# Patient Record
Sex: Male | Born: 1995 | Race: White | Hispanic: No | Marital: Single | Smoking: Never smoker
Health system: Southern US, Community
[De-identification: ages and names within clinical notes are randomized; demographics above are authoritative.]

---
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
## 2017-09-07 ENCOUNTER — Encounter (HOSPITAL_COMMUNITY): Payer: Self-pay

## 2017-09-07 ENCOUNTER — Emergency Department (HOSPITAL_COMMUNITY)
Admission: EM | Admit: 2017-09-07 | Discharge: 2017-09-08 | Disposition: A | Discharge status: Home / Self Care | Payer: Medicaid Other | Attending: Emergency Medicine | Admitting: Emergency Medicine

## 2017-09-07 DIAGNOSIS — S0990XA Unspecified injury of head, initial encounter: Secondary | ICD-10-CM

## 2017-09-07 DIAGNOSIS — Y939 Activity, unspecified: Secondary | ICD-10-CM | POA: Diagnosis not present

## 2017-09-07 DIAGNOSIS — W1830XA Fall on same level, unspecified, initial encounter: Secondary | ICD-10-CM | POA: Insufficient documentation

## 2017-09-07 DIAGNOSIS — Y990 Civilian activity done for income or pay: Secondary | ICD-10-CM | POA: Insufficient documentation

## 2017-09-07 DIAGNOSIS — Y929 Unspecified place or not applicable: Secondary | ICD-10-CM | POA: Diagnosis not present

## 2017-09-07 DIAGNOSIS — S060X1A Concussion with loss of consciousness of 30 minutes or less, initial encounter: Secondary | ICD-10-CM | POA: Diagnosis present

## 2017-09-07 DIAGNOSIS — R51 Headache: Secondary | ICD-10-CM | POA: Insufficient documentation

## 2017-09-07 NOTE — ED Triage Notes (Signed)
Pt slipped and fell on an oil spot yesterday at work, he hit his head on a metal rack and states that he passed out Pt says he was out for 10 minutes and it was witnessed by his boss and mother, noone called EMS they just tried to wake him up Pt wants to be checked for a concussion to day

## 2017-09-08 MED ORDER — METOCLOPRAMIDE HCL 10 MG PO TABS: 10.0000 mg | ORAL_TABLET | Freq: Four times a day (QID) | ORAL | 0 refills | Status: AC | PRN

## 2017-09-08 MED ORDER — IBUPROFEN 600 MG PO TABS: 600.0000 mg | ORAL_TABLET | Freq: Four times a day (QID) | ORAL | 0 refills | Status: AC | PRN

## 2017-09-08 NOTE — Discharge Instructions (Signed)
It is likely that you have a mild concussion as a result of your head injury.  We recommend that you avoid strenuous activity or heavy lifting for the next 5 days.  Continue with ibuprofen every 6 hours as needed for headache.  You may supplement this with Reglan as needed for nausea.  Follow-up with a primary care doctor to ensure resolution of symptoms.  You may return to the emergency department, as needed, if symptoms persist or worsen.

## 2017-09-08 NOTE — ED Provider Notes (Signed)
[facility]-EMERGENCY DEPT Provider Note   CSN: [id number] Arrival date & time: 09/07/17  1957    History   Chief Complaint Chief Complaint  Patient presents with  . Head Injury    HPI Nathan Rose is a 22 y.o. male.  22 year old male presents to the emergency department for evaluation of head injury.  He states that he was working yesterday when he slipped on some oil falling forward into a metal rack.  He reports losing consciousness at this time.  He believes he was unconscious for 10 minutes, but is specifically unsure of the length of time he was unconscious.  Patient took ibuprofen yesterday for headache.  He continues to have a headache today which is primarily in his frontal region radiating around to his bilateral temples and occiput.  He took 2 tablets of ibuprofen today with little improvement.  He has had some nausea which has been waxing and waning.  No vomiting.  No blurry vision, vision loss, hearing loss, extremity numbness or weakness.  He has not had any photophobia or phonophobia.  No history of prior concussion.   The history is provided by the patient. No language interpreter was used.  Head Injury      History reviewed. No pertinent past medical history.  There are no active problems to display for this patient.   History reviewed. No pertinent surgical history.     Home Medications    Prior to Admission medications   Medication Sig Start Date End Date Taking? Authorizing Provider  ibuprofen (ADVIL,MOTRIN) 600 MG tablet Take 1 tablet (600 mg total) by mouth every 6 (six) hours as needed. 09/08/17   Antony MaduraHumes, Elridge Stemm, PA-C  lisdexamfetamine (VYVANSE) 50 MG capsule Take 50 mg by mouth daily.    [provider]  metoCLOPramide (REGLAN) 10 MG tablet Take 1 tablet (10 mg total) by mouth every 6 (six) hours as needed (for nausea or headache). 09/08/17   Antony MaduraHumes, Abad Manard, PA-C  promethazine (PHENERGAN) 25 MG tablet Take 1 tablet (25 mg  total) by mouth every 6 (six) hours as needed for nausea or vomiting. 06/04/16   Antony MaduraHumes, Eliana Lueth, PA-C    Family History History reviewed. No pertinent family history.  Social History Social History   Tobacco Use  . Smoking status: Never Smoker  . Smokeless tobacco: Never Used  Substance Use Topics  . Alcohol use: No  . Drug use: No     Allergies   Patient has no known allergies.   Review of Systems Review of Systems Ten systems reviewed and are negative for acute change, except as noted in the HPI.    Physical Exam Updated Vital Signs BP 133/83 (BP Location: Right Arm)   Pulse 79   Temp 98.1 F (36.7 C) (Oral)   Resp 12   Ht 5\' 6"  (1.676 m)   Wt 68.3 kg (150 lb 9.6 oz)   SpO2 99%   BMI 24.31 kg/m   Physical Exam  Constitutional: He is oriented to person, place, and time. He appears well-developed and well-nourished. No distress.  Nontoxic appearing and in NAD  HENT:  Head: Normocephalic and atraumatic.  Abrasion to forehead without hematoma  Eyes: Conjunctivae and EOM are normal. Pupils are equal, round, and reactive to light. No scleral icterus.  EOMs normal.  No nystagmus.  Neck: Normal range of motion.  Cardiovascular: Normal rate, regular rhythm and intact distal pulses.  Pulmonary/Chest: Effort normal. No stridor. No respiratory distress.  Respirations even and unlabored  Musculoskeletal: Normal range of motion.  Neurological: He is alert and oriented to person, place, and time. No cranial nerve deficit. He exhibits normal muscle tone. Coordination normal.  GCS 15. Speech is goal oriented. No cranial nerve deficits appreciated; symmetric eyebrow raise, no facial drooping, tongue midline. Patient has equal grip strength bilaterally with 5/5 strength against resistance in all major muscle groups bilaterally. Sensation to light touch intact. Patient moves extremities without ataxia. Patient ambulatory with steady gait.  Skin: Skin is warm and dry. No rash noted.  He is not diaphoretic. No erythema. No pallor.  Psychiatric: He has a normal mood and affect. His behavior is normal.  Nursing note and vitals reviewed.    ED Treatments / Results  Labs (all labs ordered are listed, but only abnormal results are displayed) Labs Reviewed - No data to display  EKG  EKG Interpretation None       Radiology No results found.  Procedures Procedures (including critical care time)  Medications Ordered in ED Medications - No data to display   Initial Impression / Assessment and Plan / ED Course  I have reviewed the triage vital signs and the nursing notes.  Pertinent labs & imaging results that were available during my care of the patient were reviewed by me and considered in my medical decision making (see chart for details).     Patient presents to the emergency department for evaluation of headache which began yesterday after a fall into a metal rack.  +LOC without vomiting; intermittent nausea.  Neurologic exam today is nonfocal.  Doubt emergent intracranial process such as traumatic hydrocephalus, hemorrhage, skull fracture.  Have discussed the possibility of mild concussion with the patient as well as supportive management.  I do not believe further emergent workup is indicated at this time.  Return precautions discussed and provided.  Patient discharged in stable condition with no unaddressed concerns.   Final Clinical Impressions(s) / ED Diagnoses   Final diagnoses:  Minor head injury, initial encounter    ED Discharge Orders        Ordered    ibuprofen (ADVIL,MOTRIN) 600 MG tablet  Every 6 hours PRN     09/08/17 0004    metoCLOPramide (REGLAN) 10 MG tablet  Every 6 hours PRN     09/08/17 0004       Antony Madura, PA-C 09/08/17 0020    Palumbo, April, MD 09/08/17 6045

## 2017-12-20 IMAGING — DX DG ABDOMEN 2V
2 series · 2 of 2 positions shown · non-contrast
Comparison: None.

CLINICAL DATA: Vomiting for 1 hour

EXAM:
ABDOMEN - 2 VIEW

[abdomen erect]
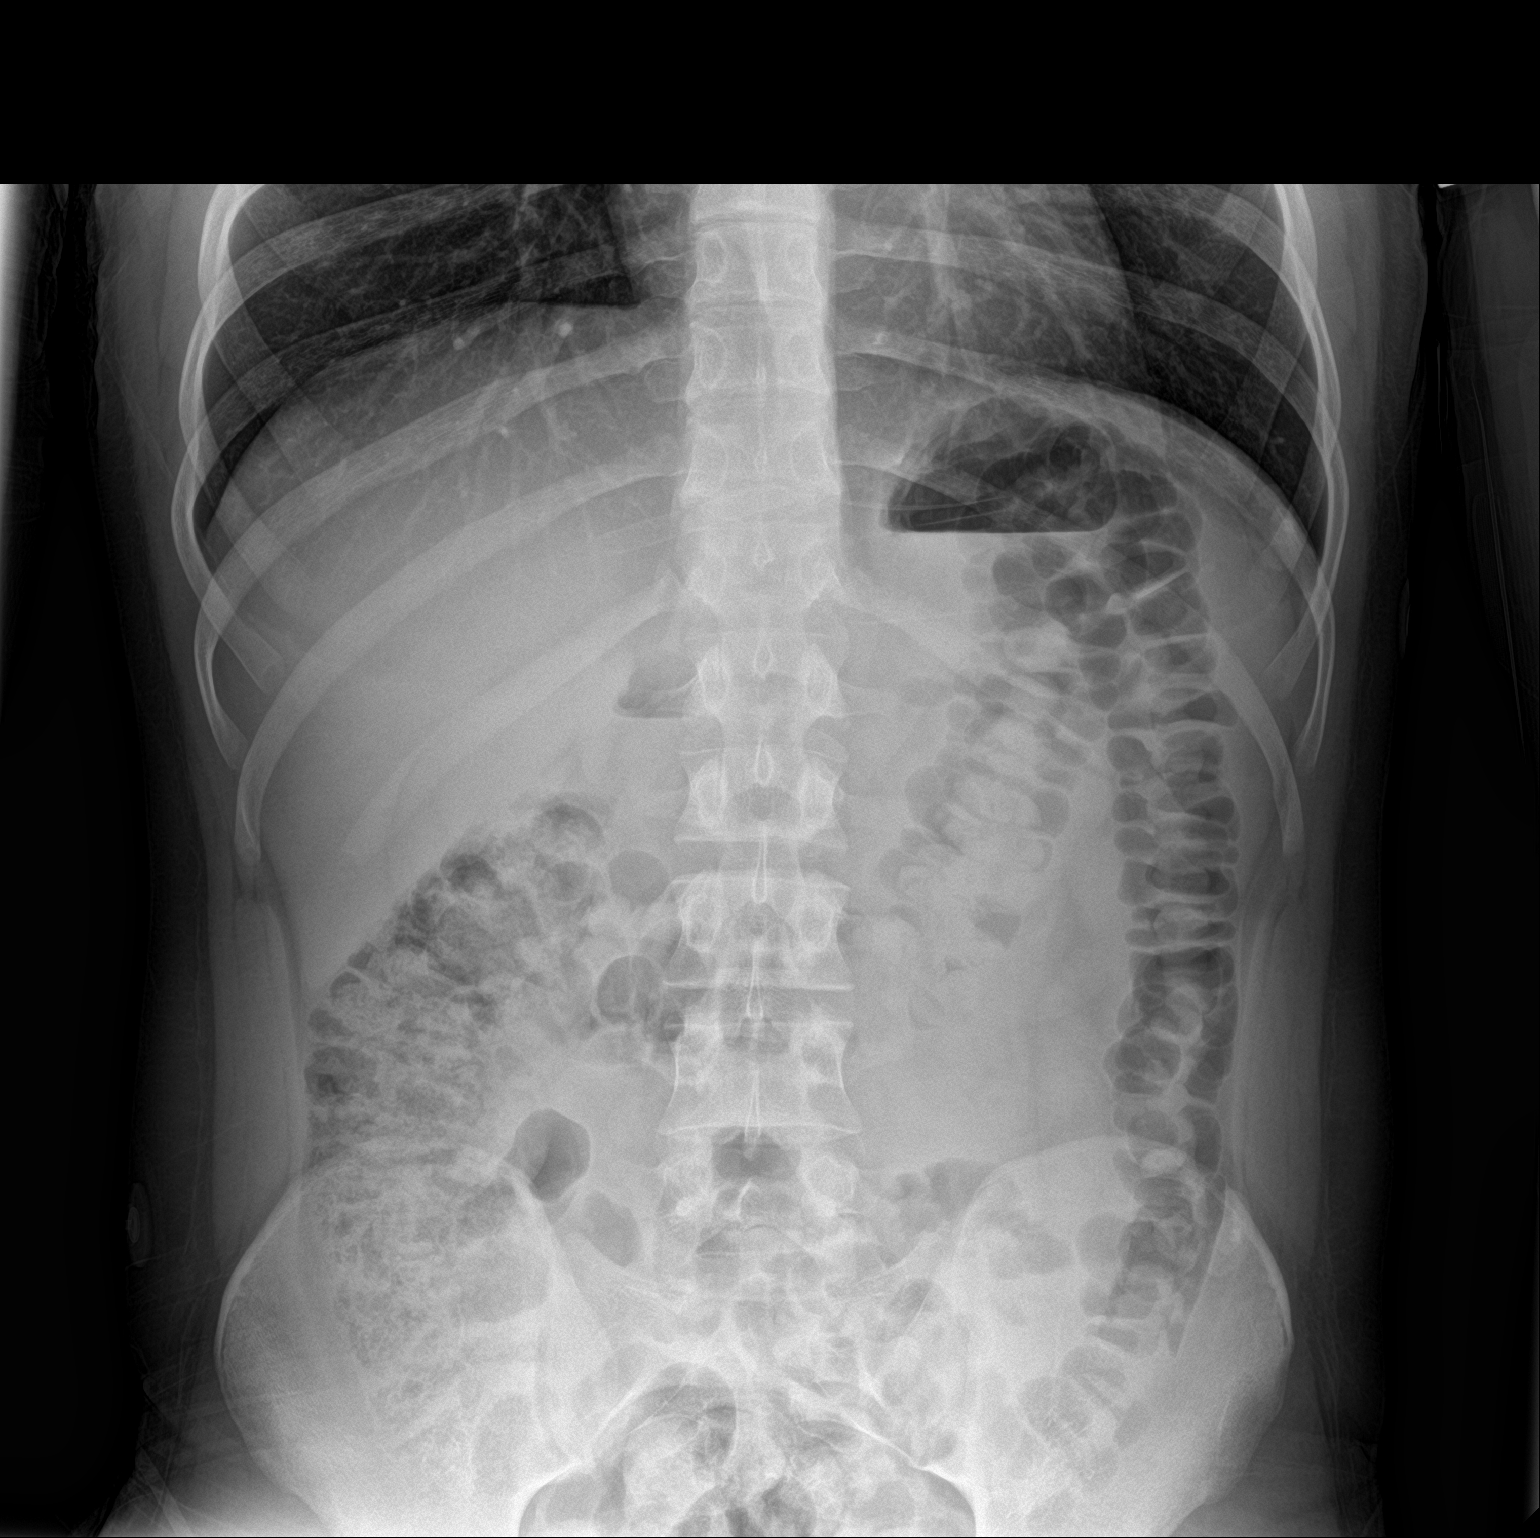

[abdomen supine]
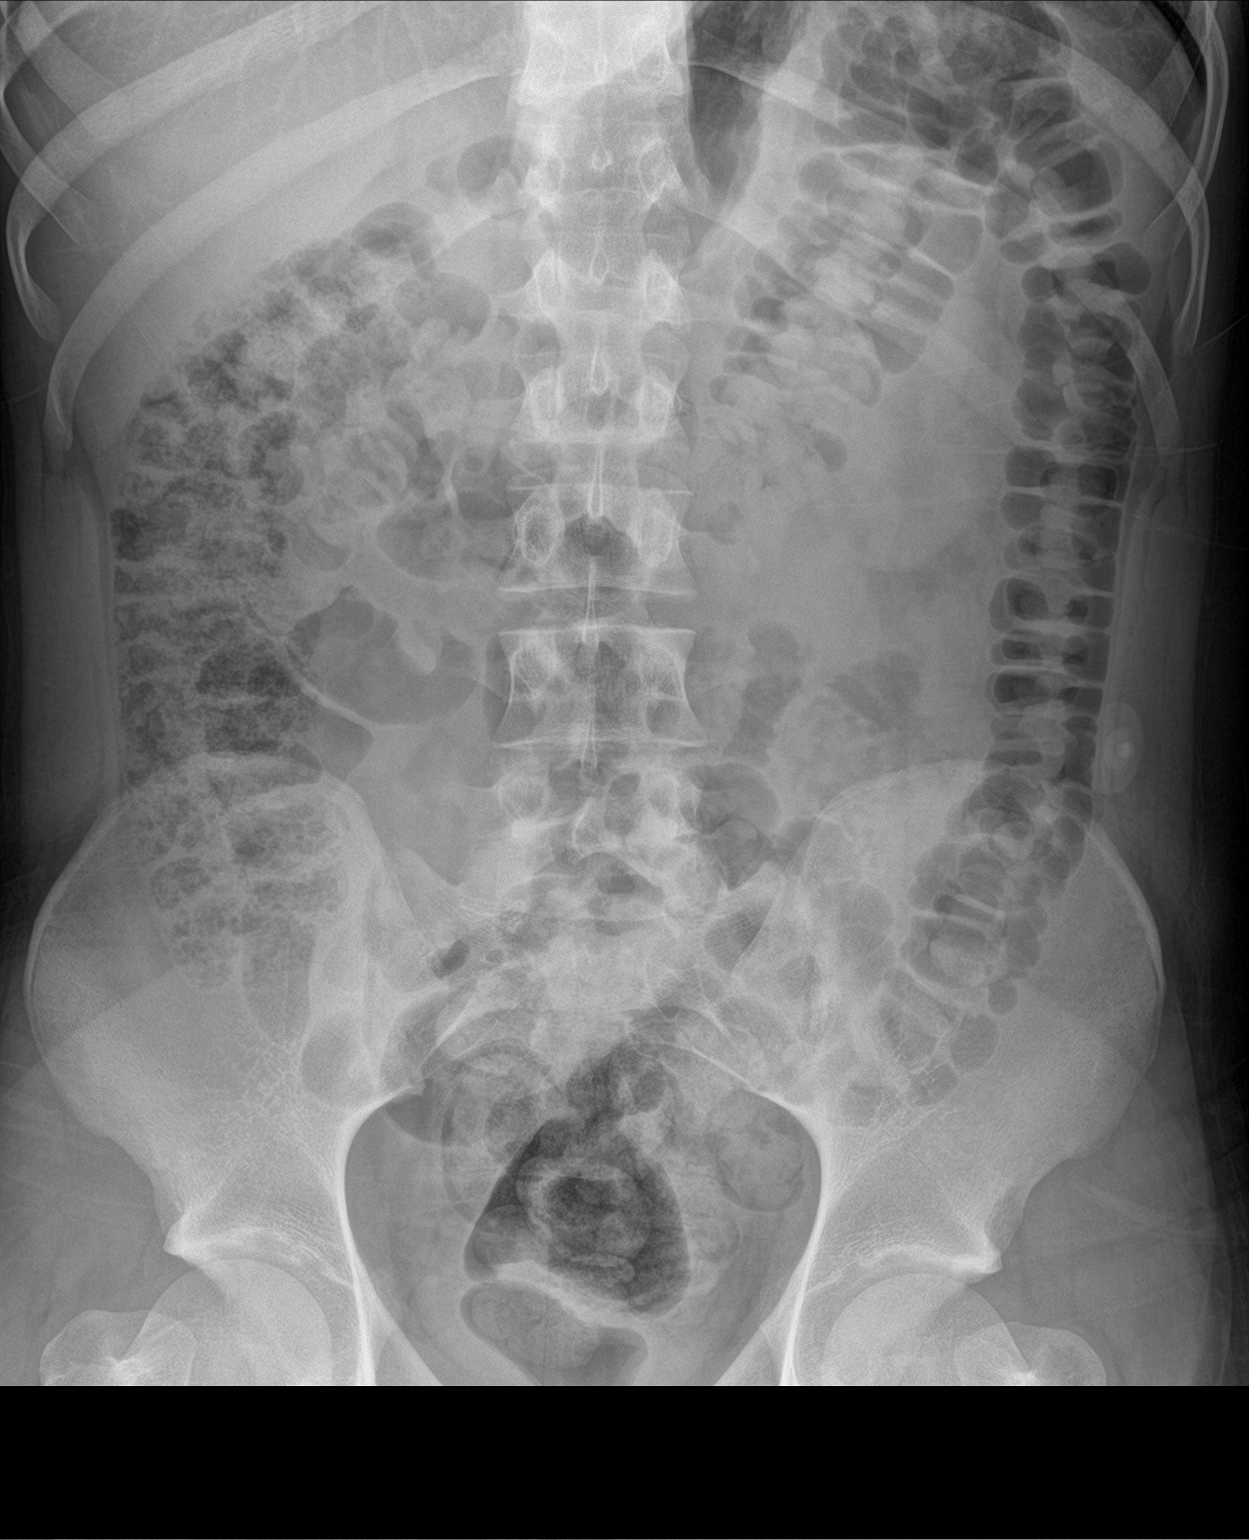

[2 of 2 positions shown; findings below may reference images not displayed]

FINDINGS: The bowel gas pattern is normal. There is a moderate amount of stool
within the ascending colon. There is no evidence of free air. No
radio-opaque calculi or other significant radiographic abnormality
is seen.
IMPRESSION: No radiographic evidence of obstruction. No free intraperitoneal
air.

## 2018-05-20 ENCOUNTER — Encounter (HOSPITAL_COMMUNITY): Payer: Self-pay | Admitting: Emergency Medicine

## 2018-05-20 ENCOUNTER — Other Ambulatory Visit: Payer: Self-pay

## 2018-05-20 ENCOUNTER — Emergency Department (HOSPITAL_COMMUNITY)
Admission: EM | Admit: 2018-05-20 | Discharge: 2018-05-21 | Disposition: A | Discharge status: Home / Self Care | Payer: Medicaid Other | Attending: Emergency Medicine | Admitting: Emergency Medicine

## 2018-05-20 DIAGNOSIS — W268XXA Contact with other sharp object(s), not elsewhere classified, initial encounter: Secondary | ICD-10-CM | POA: Insufficient documentation

## 2018-05-20 DIAGNOSIS — S61512A Laceration without foreign body of left wrist, initial encounter: Secondary | ICD-10-CM

## 2018-05-20 DIAGNOSIS — Y999 Unspecified external cause status: Secondary | ICD-10-CM | POA: Insufficient documentation

## 2018-05-20 DIAGNOSIS — Z79899 Other long term (current) drug therapy: Secondary | ICD-10-CM | POA: Insufficient documentation

## 2018-05-20 DIAGNOSIS — Y92831 Amusement park as the place of occurrence of the external cause: Secondary | ICD-10-CM | POA: Insufficient documentation

## 2018-05-20 DIAGNOSIS — Y9301 Activity, walking, marching and hiking: Secondary | ICD-10-CM | POA: Diagnosis not present

## 2018-05-20 NOTE — ED Notes (Signed)
Bed: WTR9 Expected date:  Expected time:  Means of arrival:  Comments: 

## 2018-05-20 NOTE — ED Notes (Signed)
Bed: WTR6 Expected date:  Expected time:  Means of arrival:  Comments: 

## 2018-05-20 NOTE — ED Triage Notes (Signed)
Pt from home with c/o laceration to left wrist. Pt works at [employer] and while working hit his wrist on an exposed nail. 2mm sized puncture wound is visible. Wound flushed and wrapped. Bleeding controlled. Ring is on left hand. Pt is attempting to move ring. Hand is swelling

## 2018-05-21 NOTE — ED Provider Notes (Signed)
[facility]-EMERGENCY DEPT Provider Note   CSN: [id number] Arrival date & time: 05/20/18  2248     History   Chief Complaint Chief Complaint  Patient presents with  . Laceration    HPI Nathan Rose is a 22 y.o. male.  Patient presents to the emergency department with a chief complaint of laceration.  He states that he cut his hand on a nail at a haunted house tonight.  Bleeding controlled with gauze.  Last tetanus shot was 1 year ago.  States that he has had some swelling in his middle finger, and has to struggle to get his ring off.  However, he denies injury to this finger.  He denies any other associated symptoms.  The history is provided by the patient. No language interpreter was used.    History reviewed. No pertinent past medical history.  There are no active problems to display for this patient.   History reviewed. No pertinent surgical history.      Home Medications    Prior to Admission medications   Medication Sig Start Date End Date Taking? Authorizing Provider  ibuprofen (ADVIL,MOTRIN) 600 MG tablet Take 1 tablet (600 mg total) by mouth every 6 (six) hours as needed. 09/08/17   Antony Madura, PA-C  lisdexamfetamine (VYVANSE) 50 MG capsule Take 50 mg by mouth daily.    [provider]  metoCLOPramide (REGLAN) 10 MG tablet Take 1 tablet (10 mg total) by mouth every 6 (six) hours as needed (for nausea or headache). 09/08/17   Antony Madura, PA-C  promethazine (PHENERGAN) 25 MG tablet Take 1 tablet (25 mg total) by mouth every 6 (six) hours as needed for nausea or vomiting. 06/04/16   Antony Madura, PA-C    Family History No family history on file.  Social History Social History   Tobacco Use  . Smoking status: Never Smoker  . Smokeless tobacco: Never Used  Substance Use Topics  . Alcohol use: No  . Drug use: No     Allergies   Patient has no known allergies.   Review of Systems Review of Systems  All other systems  reviewed and are negative.    Physical Exam Updated Vital Signs There were no vitals taken for this visit.  Physical Exam  Constitutional: He is oriented to person, place, and time. No distress.  HENT:  Head: Normocephalic and atraumatic.  Eyes: Pupils are equal, round, and reactive to light. Conjunctivae and EOM are normal.  Neck: No tracheal deviation present.  Cardiovascular: Normal rate.  Pulmonary/Chest: Effort normal. No respiratory distress.  Abdominal: Soft.  Musculoskeletal: Normal range of motion.  Neurological: He is alert and oriented to person, place, and time.  Skin: Skin is warm and dry. He is not diaphoretic.  Minor, 1 cm curvilinear laceration to the anterior aspect of distal left wrist, no deep structural involvement, no foreign body  Psychiatric: Judgment normal.  Nursing note and vitals reviewed.    ED Treatments / Results  Labs (all labs ordered are listed, but only abnormal results are displayed) Labs Reviewed - No data to display  EKG None  Radiology No results found.  Procedures .Foreign Body Removal Date/Time: 05/21/2018 1:12 AM Performed by: Roxy Horseman, PA-C Authorized by: Roxy Horseman, PA-C  Consent: Verbal consent obtained. Risks and benefits: risks, benefits and alternatives were discussed Consent given by: patient Patient understanding: patient states understanding of the procedure being performed Patient consent: the patient's understanding of the procedure matches consent given Procedure consent: procedure consent  matches procedure scheduled Relevant documents: relevant documents present and verified Test results: test results available and properly labeled Site marked: the operative site was marked Imaging studies: imaging studies available Required items: required blood products, implants, devices, and special equipment available Patient identity confirmed: verbally with patient Time out: Immediately prior to procedure  a "time out" was called to verify the correct patient, procedure, equipment, support staff and site/side marked as required. Body area: skin General location: upper extremity Location details: left long finger Patient restrained: no Patient cooperative: yes Complexity: simple 1 objects recovered. Objects recovered: Ring removed Post-procedure assessment: foreign body removed Patient tolerance: Patient tolerated the procedure well with no immediate complications Comments: Manual ring cutter was used to remove the patient's ring   (including critical care time) LACERATION REPAIR Performed by: Roxy Horseman Authorized by: Roxy Horseman Consent: Verbal consent obtained. Risks and benefits: risks, benefits and alternatives were discussed Consent given by: patient Patient identity confirmed: provided demographic data Prepped and Draped in normal sterile fashion Wound explored  Laceration Location: Left wrist  Laceration Length: 1 cm  No Foreign Bodies seen or palpated  Anesthesia: none  Local anesthetic: none  Anesthetic total:0 Irrigation method: syringe Amount of cleaning: standard  Skin closure: Dermabond  Number of sutures: Dermabond  Technique: Dermabond  Patient tolerance: Patient tolerated the procedure well with no immediate complications.  Medications Ordered in ED Medications - No data to display   Initial Impression / Assessment and Plan / ED Course  I have reviewed the triage vital signs and the nursing notes.  Pertinent labs & imaging results that were available during my care of the patient were reviewed by me and considered in my medical decision making (see chart for details).     Small laceration to left wrist.  Repaired with Dermabond.  No foreign body.  Tetanus shot is up-to-date.  Ring from left finger is removed by me.  Normal range of motion and strength of all fingers of left hand isolated all joints.  Final Clinical Impressions(s) / ED  Diagnoses   Final diagnoses:  Laceration of left wrist, initial encounter    ED Discharge Orders    None       Roxy Horseman, PA-C 05/21/18 0117    Gilda Crease, MD 05/21/18 [phone]

## 2018-06-14 ENCOUNTER — Ambulatory Visit (HOSPITAL_COMMUNITY)
Admission: RE | Admit: 2018-06-14 | Discharge: 2018-06-14 | Disposition: A | Discharge status: Home / Self Care | Payer: Federal, State, Local not specified - Other | Attending: Psychiatry | Admitting: Psychiatry

## 2018-06-14 DIAGNOSIS — F331 Major depressive disorder, recurrent, moderate: Secondary | ICD-10-CM | POA: Diagnosis present

## 2018-06-14 NOTE — H&P (Signed)
Behavioral Health Medical Screening Exam  Nathan Rose is an 22 y.o. male.  Total Time spent with patient: 20 minutes  Psychiatric Specialty Exam: Physical Exam  Nursing note and vitals reviewed. Constitutional: He is oriented to person, place, and time. He appears well-developed and well-nourished.  Cardiovascular: Normal rate.  Respiratory: Effort normal.  Musculoskeletal: Normal range of motion.  Neurological: He is alert and oriented to person, place, and time.  Skin: Skin is warm.    Review of Systems  Constitutional: Negative.   HENT: Negative.   Eyes: Negative.   Respiratory: Negative.   Cardiovascular: Negative.   Gastrointestinal: Negative.   Genitourinary: Negative.   Musculoskeletal: Negative.   Skin: Negative.   Neurological: Negative.   Endo/Heme/Allergies: Negative.   Psychiatric/Behavioral: Positive for depression. Negative for hallucinations, substance abuse and suicidal ideas. The patient is nervous/anxious.     Blood pressure 118/85, pulse 76, temperature 98.5 F (36.9 C), SpO2 100 %.There is no height or weight on file to calculate BMI.  General Appearance: Disheveled  Eye Contact:  Good  Speech:  Clear and Coherent and Normal Rate  Volume:  Normal  Mood:  Depressed  Affect:  Flat  Thought Process:  Coherent and Descriptions of Associations: Intact  Orientation:  Full (Time, Place, and Person)  Thought Content:  WDL  Suicidal Thoughts:  No  Homicidal Thoughts:  No  Memory:  Immediate;   Good Recent;   Good Remote;   Good  Judgement:  Fair  Insight:  Fair  Psychomotor Activity:  Normal  Concentration: Concentration: Good and Attention Span: Good  Recall:  Good  Fund of Knowledge:Good  Language: Good  Akathisia:  No  Handed:  Right  AIMS (if indicated):     Assets:  Communication Skills Desire for Improvement Financial Resources/Insurance Housing Physical Health Social Support Transportation  Sleep:       Musculoskeletal: Strength  & Muscle Tone: within normal limits Gait & Station: normal Patient leans: N/A  Blood pressure 118/85, pulse 76, temperature 98.5 F (36.9 C), SpO2 100 %.  Recommendations:  Based on my evaluation the patient does not appear to have an emergency medical condition.  Gerlene Burdockravis B Elih Mooney, FNP 06/14/2018, 1:50 PM

## 2018-06-14 NOTE — BH Assessment (Addendum)
Assessment Note  Martyn EhrichJacob Rose is an 22 y.o. male.  The pt came in due to depression. The pt stated he has been feeling depressed since his father and step father died in 2017.  The pt stated he is sleeping about 4 hours a night and has lost 30 lbs in the past 3 months.  He is currently not seeing a counselor nor a psychiatrist.  The pt last saw a counselor about 5 years ago in [place].    The pt lives with his biological mother, step father and grand mother.  The pt denies SI, HI, self harm, legal issues, history of abuse and psychosis.  The pt reported feeling down and has trouble concentrating.  The pt denies SA.  Pt is dressed in casual clothes. He is alert and oriented x4. Pt speaks in a clear tone, at moderate volume and normal pace. Eye contact is good. Pt's mood is depressed. Thought process is coherent and relevant. There is no indication Pt is currently responding to internal stimuli or experiencing delusional thought content.?Pt was cooperative throughout assessment.   Diagnosis: F33.1 Major depressive disorder, Recurrent episode, Moderate   Past Medical History: No past medical history on file.  No past surgical history on file.  Family History: No family history on file.  Social History:  reports that he has never smoked. He has never used smokeless tobacco. He reports that he does not drink alcohol or use drugs.  Additional Social History:  Alcohol / Drug Use Pain Medications: See MAR Prescriptions: See MAR Over the Counter: See MAR History of alcohol / drug use?: No history of alcohol / drug abuse Longest period of sobriety (when/how long): NA  CIWA:   COWS:    Allergies: No Known Allergies  Home Medications:  (Not in a hospital admission)  OB/GYN Status:  No LMP for male patient.  General Assessment Data [facility] Assessment: In system Is this a Tele or Face-to-Face Assessment?: Face-to-Face Is this an Initial Assessment or a  Re-assessment for this encounter?: Initial Assessment Patient Accompanied by:: N/A Language Other than English: No Living Arrangements: Other (Comment)(home) What gender do you identify as?: Male Marital status: Single Maiden name: NA Pregnancy Status: No Living Arrangements: Parent, Other relatives Can pt return to current living arrangement?: Yes Admission Status: Voluntary Is patient capable of signing voluntary admission?: No Referral Source: Self/Family/Friend Insurance type: Self Pay     Crisis Care Plan Living Arrangements: Parent, Other relatives Legal Guardian: Other:(Self) Name of Psychiatrist: none Name of Therapist: none  Education Status Is patient currently in school?: No Is the patient employed, unemployed or receiving disability?: Employed  Risk to self with the past 6 months Suicidal Ideation: No Has patient been a risk to self within the past 6 months prior to admission? : No Suicidal Intent: No Has patient had any suicidal intent within the past 6 months prior to admission? : No Is patient at risk for suicide?: No Suicidal Plan?: No Has patient had any suicidal plan within the past 6 months prior to admission? : No Access to Means: No What has been your use of drugs/alcohol within the last 12 months?: none Previous Attempts/Gestures: No How many times?: 0 Other Self Harm Risks: none Triggers for Past Attempts: None known Intentional Self Injurious Behavior: None Family Suicide History: No Recent stressful life event(s): Loss (Comment)(both parents died 2 years ago) Persecutory voices/beliefs?: No Depression: Yes Depression Symptoms: Despondent, Insomnia, Loss of interest in usual pleasures Substance  abuse history and/or treatment for substance abuse?: No Suicide prevention information given to non-admitted patients: Yes  Risk to Others within the past 6 months Homicidal Ideation: No Does patient have any lifetime risk of violence toward others  beyond the six months prior to admission? : No Thoughts of Harm to Others: No Current Homicidal Intent: No Current Homicidal Plan: No Access to Homicidal Means: No Identified Victim: (none) History of harm to others?: No Assessment of Violence: None Noted Violent Behavior Description: none Does patient have access to weapons?: No Criminal Charges Pending?: No Does patient have a court date: No Is patient on probation?: No  Psychosis Hallucinations: None noted Delusions: None noted  Mental Status Report Appearance/Hygiene: Unremarkable Eye Contact: Good Motor Activity: Freedom of movement Speech: Logical/coherent Level of Consciousness: Alert Mood: Depressed Affect: Depressed Anxiety Level: None Thought Processes: Coherent, Relevant Judgement: Partial Orientation: Person, Place, Time, Situation Obsessive Compulsive Thoughts/Behaviors: None  Cognitive Functioning Concentration: Normal Memory: Recent Intact, Remote Intact Is patient IDD: No Insight: Fair Impulse Control: Fair Appetite: Poor Have you had any weight changes? : Loss Amount of the weight change? (lbs): 50 lbs Sleep: Decreased Total Hours of Sleep: 4 Vegetative Symptoms: None  ADLScreening [facility] Assessment Services) Patient's cognitive ability adequate to safely complete daily activities?: Yes Patient able to express need for assistance with ADLs?: Yes Independently performs ADLs?: Yes (appropriate for developmental age)  Prior Inpatient Therapy Prior Inpatient Therapy: No  Prior Outpatient Therapy Prior Outpatient Therapy: Yes Prior Therapy Dates: 2015 Prior Therapy Facilty/Provider(s): Pt can't remember Reason for Treatment: depression and ADHD Does patient have an ACCT team?: No Does patient have Intensive In-House Services?  : No Does patient have [facility] services? : No Does patient have P4[facility] services?: No  ADL Screening (condition at time of admission) Patient's cognitive ability adequate  to safely complete daily activities?: Yes Patient able to express need for assistance with ADLs?: Yes Independently performs ADLs?: Yes (appropriate for developmental age)       Abuse/Neglect Assessment (Assessment to be complete while patient is alone) Abuse/Neglect Assessment Can Be Completed: Yes Physical Abuse: Denies Verbal Abuse: Denies Sexual Abuse: Denies Exploitation of patient/patient's resources: Denies Self-Neglect: Denies Values / Beliefs Cultural Requests During Hospitalization: None Spiritual Requests During Hospitalization: None Consults Spiritual Care Consult Needed: No Social Work Consult Needed: No            Disposition:  Disposition Initial Assessment Completed for this Encounter: Yes Disposition of Patient: Discharge Patient refused recommended treatment: No Mode of transportation if patient is discharged?: Car  On Site Evaluation by:   Reviewed with Physician:    Ottis Stain 06/14/2018 1:37 PM

## 2018-06-30 ENCOUNTER — Emergency Department (HOSPITAL_COMMUNITY)
Admission: EM | Admit: 2018-06-30 | Discharge: 2018-06-30 | Disposition: A | Discharge status: Home / Self Care | Payer: Medicaid Other | Attending: Emergency Medicine | Admitting: Emergency Medicine

## 2018-06-30 ENCOUNTER — Other Ambulatory Visit: Payer: Self-pay

## 2018-06-30 ENCOUNTER — Encounter (HOSPITAL_COMMUNITY): Payer: Self-pay

## 2018-06-30 DIAGNOSIS — F329 Major depressive disorder, single episode, unspecified: Secondary | ICD-10-CM | POA: Diagnosis present

## 2018-06-30 DIAGNOSIS — F332 Major depressive disorder, recurrent severe without psychotic features: Secondary | ICD-10-CM | POA: Insufficient documentation

## 2018-06-30 DIAGNOSIS — Z79899 Other long term (current) drug therapy: Secondary | ICD-10-CM | POA: Diagnosis not present

## 2018-06-30 DIAGNOSIS — D72829 Elevated white blood cell count, unspecified: Secondary | ICD-10-CM | POA: Insufficient documentation

## 2018-06-30 LAB — COMPREHENSIVE METABOLIC PANEL
ALT: 17 U/L (ref 0–44)
ANION GAP: 9 (ref 5–15)
AST: 22 U/L (ref 15–41)
Albumin: 4.1 g/dL (ref 3.5–5.0)
Alkaline Phosphatase: 70 U/L (ref 38–126)
BUN: 8 mg/dL (ref 6–20)
CHLORIDE: 107 mmol/L (ref 98–111)
CO2: 26 mmol/L (ref 22–32)
Calcium: 9.8 mg/dL (ref 8.9–10.3)
Creatinine, Ser: 0.93 mg/dL (ref 0.61–1.24)
GFR calc Af Amer: >60 mL/min (ref >60)
GFR calc non Af Amer: >60 mL/min (ref >60)
Glucose, Bld: 95 mg/dL (ref 70–99)
POTASSIUM: 4 mmol/L (ref 3.5–5.1)
SODIUM: 142 mmol/L (ref 135–145)
Total Bilirubin: 0.5 mg/dL (ref 0.3–1.2)
Total Protein: 6.8 g/dL (ref 6.5–8.1)

## 2018-06-30 LAB — CBC WITH DIFFERENTIAL/PLATELET
ABS IMMATURE GRANULOCYTES: 0.04 10*3/uL (ref 0.00–0.07)
BASOS ABS: 0.1 10*3/uL (ref 0.0–0.1)
Basophils Relative: 0 %
EOS ABS: 0.3 10*3/uL (ref 0.0–0.5)
Eosinophils Relative: 2 %
HCT: 52.9 % — ABNORMAL HIGH (ref 39.0–52.0)
Hemoglobin: 17.6 g/dL — ABNORMAL HIGH (ref 13.0–17.0)
IMMATURE GRANULOCYTES: 0 %
Lymphocytes Relative: 20 %
Lymphs Abs: 2.4 10*3/uL (ref 0.7–4.0)
MCH: 30 pg (ref 26.0–34.0)
MCHC: 33.3 g/dL (ref 30.0–36.0)
MCV: 90.3 fL (ref 80.0–100.0)
Monocytes Absolute: 0.8 10*3/uL (ref 0.1–1.0)
Monocytes Relative: 6 %
NEUTROS ABS: 8.7 10*3/uL — AB (ref 1.7–7.7)
NEUTROS PCT: 72 %
NRBC: 0 % (ref 0.0–0.2)
PLATELETS: 231 10*3/uL (ref 150–400)
RBC: 5.86 MIL/uL — AB (ref 4.22–5.81)
RDW: 12 % (ref 11.5–15.5)
WBC: 12.3 10*3/uL — AB (ref 4.0–10.5)

## 2018-06-30 LAB — RAPID URINE DRUG SCREEN, HOSP PERFORMED
Amphetamines: NOT DETECTED
BARBITURATES: NOT DETECTED
BENZODIAZEPINES: NOT DETECTED
COCAINE: NOT DETECTED
OPIATES: NOT DETECTED
TETRAHYDROCANNABINOL: NOT DETECTED

## 2018-06-30 LAB — VITAMIN B12: VITAMIN B 12: 193 pg/mL (ref 180–914)

## 2018-06-30 LAB — SALICYLATE LEVEL

## 2018-06-30 LAB — ACETAMINOPHEN LEVEL

## 2018-06-30 LAB — ETHANOL: Alcohol, Ethyl (B): <10 mg/dL (ref <10)

## 2018-06-30 LAB — TSH: TSH: 1.038 u[IU]/mL (ref 0.350–4.500)

## 2018-06-30 MED ORDER — IBUPROFEN 400 MG PO TABS: 600.0000 mg | ORAL_TABLET | Freq: Three times a day (TID) | ORAL | Status: DC | PRN

## 2018-06-30 MED ORDER — NICOTINE 21 MG/24HR TD PT24
21.0000 mg | MEDICATED_PATCH | Freq: Every day | TRANSDERMAL | Status: DC
  Administered 2018-06-30: 21 mg via TRANSDERMAL
  Filled 2018-06-30: qty 1

## 2018-06-30 NOTE — BH Assessment (Addendum)
Tele Assessment Note   Patient Name: Nathan Rose MRN: [id number] Referring Physician: Pilar Rose Location of Patient: [facility] ED Location of Provider: Behavioral Health TTS Department  Nathan Rose is an 22 y.o. male.  The pt was brought in by his mother after he made a post on face book stating he would not be here in 2020.  The pt stated he did not mean suicide, but that he was going to take a break from people and social media.  The pt denies any suicide attempts in the past.  He stated he is going to [facility] [place] and started taking Zoloft.   The pt lives with his biological mother, step father and grand mother.  The pt denies SI, HI, self harm, legal issues, history of abuse and psychosis.  The pt denies SA.  Pt is dressed in scrubs. He is alert and oriented x4. Pt speaks in a clear tone, at moderate volume and normal pace. Eye contact is good. Pt's mood is pleasant. Thought process is coherent and relevant. There is no indication Pt is currently responding to internal stimuli or experiencing delusional thought content.?Pt was cooperative throughout assessment.    Diagnosis: F33.2 Major depressive disorder, Recurrent episode, Severe   Past Medical History: No past medical history on file.  History reviewed. No pertinent surgical history.  Family History: No family history on file.  Social History:  reports that he has never smoked. He has never used smokeless tobacco. He reports that he does not drink alcohol or use drugs.  Additional Social History:  Alcohol / Drug Use Pain Medications: See MAR Prescriptions: See MAR Over the Counter: See MAR History of alcohol / drug use?: No history of alcohol / drug abuse Longest period of sobriety (when/how long): NA  CIWA: CIWA-Ar BP: 116/81 Pulse Rate: 95 COWS:    Allergies: No Known Allergies  Home Medications:  (Not in a hospital admission)  OB/GYN Status:  No LMP for male patient.  General Assessment  Data Location of Assessment: [facility] ED TTS Assessment: In system Is this a Tele or Face-to-Face Assessment?: Face-to-Face Is this an Initial Assessment or a Re-assessment for this encounter?: Initial Assessment Patient Accompanied by:: N/A Language Other than English: No Living Arrangements: Other (Comment)(home) What gender do you identify as?: Male Marital status: Single Maiden name: NA Pregnancy Status: Yes (Comment: include estimated delivery date) Living Arrangements: Parent, Other relatives Can pt return to current living arrangement?: Yes Admission Status: Voluntary Is patient capable of signing voluntary admission?: Yes Referral Source: Self/Family/Friend Insurance type: Medicaid     Crisis Care Plan Living Arrangements: Parent, Other relatives Legal Guardian: Other:(Self) Name of Psychiatrist: [facility] Name of Therapist: [facility]  Education Status Is patient currently in school?: No Is the patient employed, unemployed or receiving disability?: Employed  Risk to self with the past 6 months Suicidal Ideation: No Has patient been a risk to self within the past 6 months prior to admission? : No Suicidal Intent: No Has patient had any suicidal intent within the past 6 months prior to admission? : No Is patient at risk for suicide?: No Suicidal Plan?: No Has patient had any suicidal plan within the past 6 months prior to admission? : No Access to Means: No What has been your use of drugs/alcohol within the last 12 months?: none Previous Attempts/Gestures: No How many times?: 0 Other Self Harm Risks: none Triggers for Past Attempts: None known Intentional Self Injurious Behavior: None  Family Suicide History: No Recent stressful life event(s): Other (Comment)(denied any major stressors) Persecutory voices/beliefs?: No Depression: No Substance abuse history and/or treatment for substance abuse?: No Suicide prevention information  given to non-admitted patients: Yes  Risk to Others within the past 6 months Homicidal Ideation: No Does patient have any lifetime risk of violence toward others beyond the six months prior to admission? : No Thoughts of Harm to Others: No Current Homicidal Intent: No Current Homicidal Plan: No Access to Homicidal Means: No Identified Victim: none History of harm to others?: No Assessment of Violence: None Noted Violent Behavior Description: none Does patient have access to weapons?: No Criminal Charges Pending?: No Does patient have a court date: No Is patient on probation?: No  Psychosis Hallucinations: None noted Delusions: None noted  Mental Status Report Appearance/Hygiene: Unremarkable Eye Contact: Good Motor Activity: Freedom of movement, Unremarkable Speech: Logical/coherent Level of Consciousness: Alert Mood: Pleasant Affect: Appropriate to circumstance Anxiety Level: None Thought Processes: Coherent, Relevant Judgement: Partial Orientation: Person, Place, Time, Situation Obsessive Compulsive Thoughts/Behaviors: None  Cognitive Functioning Concentration: Normal Memory: Recent Intact, Remote Intact Is patient IDD: No Insight: Fair Impulse Control: Fair Appetite: Good Have you had any weight changes? : No Change Sleep: No Change Total Hours of Sleep: 9 Vegetative Symptoms: None  ADLScreening [facility] Assessment Services) Patient's cognitive ability adequate to safely complete daily activities?: Yes Patient able to express need for assistance with ADLs?: Yes Independently performs ADLs?: Yes (appropriate for developmental age)  Prior Inpatient Therapy Prior Inpatient Therapy: No  Prior Outpatient Therapy Prior Outpatient Therapy: Yes Prior Therapy Dates: current Prior Therapy Facilty/Provider(s): [facility] Reason for Treatment: depression and ADHD Does patient have an ACCT team?: No Does patient have Intensive In-House Services?  :  No Does patient have [facility] services? : No Does patient have P4CC services?: No  ADL Screening (condition at time of admission) Patient's cognitive ability adequate to safely complete daily activities?: Yes Patient able to express need for assistance with ADLs?: Yes Independently performs ADLs?: Yes (appropriate for developmental age)       Abuse/Neglect Assessment (Assessment to be complete while patient is alone) Abuse/Neglect Assessment Can Be Completed: Yes Physical Abuse: Denies Verbal Abuse: Denies Sexual Abuse: Denies Exploitation of patient/patient's resources: Denies Self-Neglect: Denies Values / Beliefs Cultural Requests During Hospitalization: None Spiritual Requests During Hospitalization: None Consults Spiritual Care Consult Needed: No Social Work Consult Needed: No Merchant navy officerAdvance Directives (For Healthcare) Does Patient Have a Medical Advance Directive?: No Would patient like information on creating a medical advance directive?: No - Patient declined          Disposition:  Disposition Initial Assessment Completed for this Encounter: Yes  NP Kayren Eavesakia Starks recommends the pt be psych cleared.  RN Orpha BurKaty was made aware of the recommendation.  This service was provided via telemedicine using a 2-way, interactive audio and video technology.  Names of all persons participating in this telemedicine service and their role in this encounter. Name: Martyn EhrichJacob Ammar Role: Pt  Name: Riley ChurchesKendall Dayna Geurts Role: TTS  Name:  Role:   Name:  Role:     Ottis StainGarvin, Layanna Charo Jermaine 06/30/2018 7:49 PM

## 2018-06-30 NOTE — ED Triage Notes (Signed)
Pt. Is with his mom and they express that pt. Is having intermittent periods of su cidal thoughts without a plan.  Pt. Stated, "I feel worthless."    Pt. Is alert and oriented X4.  Pt. Has a flat affect

## 2018-06-30 NOTE — ED Notes (Signed)
Pt changed into purple scrubs , pt wanded by security.

## 2018-06-30 NOTE — ED Notes (Signed)
Dinner tray ordered for patient.

## 2018-06-30 NOTE — ED Notes (Signed)
Per TTS - patient recommended for d/c

## 2018-06-30 NOTE — ED Notes (Signed)
Pt dinner tray arrived, provided with coke, updated on delays

## 2018-06-30 NOTE — ED Provider Notes (Signed)
[facility] EMERGENCY DEPARTMENT Provider Note   CSN: [id number] Arrival date & time: 06/30/18  1535     History   Chief Complaint Chief Complaint  Patient presents with  . Depression    HPI Nathan Rose is a 22 y.o. male.  HPI   Patient is a 22 year old male with a history of ADD, no other medication, presenting for worsening depressive thoughts of passive suicidal ideations.  He is brought in by his mother, and states that she was concerned about his mental status with Facebook post last night about "not being here in 2020".  His mother reports that since he has lived with her boss couple years, she feels that she is noticed that his depressive thoughts are worsening.  Patient was interviewed outside of his mother's presence, and states that he has never had a suicide attempt nor a specific plan for suicide.  He does report that "once in a blue moon", he has a passive suicidal thought.  He reports that his intention with the Facebook post was merely to state that he was thinking about moving to a different place and over.  He reports that he has struggled with the loss of his grandparents occurred approximately 4 years ago.  He was raised by his grandparents, and they both died of cancer in a relatively short period of time.  2 years prior to that a sophomore in high school, his father passed away from cancer.  He was not raised by his mother with whom he is currently living, and he reports altercations with her husband.  Patient reports daily smoking, occasional monthly marijuana use, denies any alcohol use for a couple years, and denies any other illicit drug use.  He is reporting that he has positive thoughts towards the future he needs help at present, and he wants "medication and counseling".  Patient reports that he is going to the [facility] [place] and taking Zoloft presently.  Patient denies access to firearms, denies access any other medication besides  Zoloft.  No past medical history on file.  There are no active problems to display for this patient.   History reviewed. No pertinent surgical history.      Home Medications    Prior to Admission medications   Medication Sig Start Date End Date Taking? Authorizing Provider  ibuprofen (ADVIL,MOTRIN) 600 MG tablet Take 1 tablet (600 mg total) by mouth every 6 (six) hours as needed. 09/08/17   Antony Madura, PA-C  lisdexamfetamine (VYVANSE) 50 MG capsule Take 50 mg by mouth daily.    [provider]  metoCLOPramide (REGLAN) 10 MG tablet Take 1 tablet (10 mg total) by mouth every 6 (six) hours as needed (for nausea or headache). 09/08/17   Antony Madura, PA-C  promethazine (PHENERGAN) 25 MG tablet Take 1 tablet (25 mg total) by mouth every 6 (six) hours as needed for nausea or vomiting. 06/04/16   Antony Madura, PA-C    Family History No family history on file.  Social History Social History   Tobacco Use  . Smoking status: Never Smoker  . Smokeless tobacco: Never Used  Substance Use Topics  . Alcohol use: No  . Drug use: No     Allergies   Patient has no known allergies.   Review of Systems Review of Systems  Gastrointestinal: Negative for abdominal pain, nausea and vomiting.  Psychiatric/Behavioral: Positive for decreased concentration and dysphoric mood. Negative for agitation, hallucinations, self-injury, sleep disturbance and suicidal ideas. The patient  is not nervous/anxious.   All other systems reviewed and are negative.    Physical Exam Updated Vital Signs BP 116/81 (BP Location: Right Arm)   Pulse 95   Temp 98.6 F (37 C) (Oral)   Resp 18   Ht 5\' 8"  (1.727 m)   Wt 79.4 kg   SpO2 100%   BMI 26.61 kg/m   Physical Exam  Constitutional: He appears well-developed and well-nourished. No distress.  Clothes dirty but otherwise appears well kempt and in NAD.   HENT:  Head: Normocephalic and atraumatic.  Mouth/Throat: Oropharynx is clear and moist.    Eyes: Pupils are equal, round, and reactive to light. Conjunctivae and EOM are normal.  Neck: Normal range of motion. Neck supple.  Cardiovascular: Normal rate, regular rhythm, S1 normal and S2 normal.  No murmur heard. Pulmonary/Chest: Effort normal and breath sounds normal. He has no wheezes. He has no rales.  Abdominal: Soft. He exhibits no distension.  Musculoskeletal: Normal range of motion. He exhibits no edema or deformity.  Neurological: He is alert.  Cranial nerves grossly intact. Patient moves extremities symmetrically and with good coordination. Normal and symmetric gait.  Skin: Skin is warm and dry. No rash noted. No erythema.  Psychiatric:  Slightly flattened affect.  Alert and oriented x4.  Patient has goal-directed speech, and good insight into his situation. He reports forward thinking towards the future.  No suicidal ideas on exam.  Not responding to internal stimuli.  Nursing note and vitals reviewed.    ED Treatments / Results  Labs (all labs ordered are listed, but only abnormal results are displayed) Labs Reviewed  CBC WITH DIFFERENTIAL/PLATELET - Abnormal; Notable for the following components:      Result Value   WBC 12.3 (*)    RBC 5.86 (*)    Hemoglobin 17.6 (*)    HCT 52.9 (*)    Neutro Abs 8.7 (*)    All other components within normal limits  ACETAMINOPHEN LEVEL - Abnormal; Notable for the following components:   Acetaminophen (Tylenol), Serum <10 (*)    All other components within normal limits  COMPREHENSIVE METABOLIC PANEL  ETHANOL  RAPID URINE DRUG SCREEN, HOSP PERFORMED  SALICYLATE LEVEL  TSH  VITAMIN B12    EKG None  Radiology No results found.  Procedures Procedures (including critical care time)  Medications Ordered in ED Medications - No data to display   Initial Impression / Assessment and Plan / ED Course  I have reviewed the triage vital signs and the nursing notes.  Pertinent labs & imaging results that were available  during my care of the patient were reviewed by me and considered in my medical decision making (see chart for details).     Patient is a 22 year old male with a history of major depressive disorder presenting for worsening depressive thoughts.  Initially presenting with his mother with concerns that he made a Facebook post with finality to it and she was contacted due to concerns about suicidal ideation.  Patient is denying this today.  Patient is denying ever having any suicidal thoughts or attempts.  He is in his first couple weeks of Zoloft, so he is at high risk and in a high risk demographic for suicidal ideations in the setting of initiating SSRI therapy.  Patient was evaluated by TTS, who consulted psychiatry, and recommended continued outpatient therapy.  Patient does contract for safety today.  He does not have any access to lethal firearms at home per his report.  Lab work unremarkable for other concomitant causes of his depression.  Patient does have a slight leukocytosis and elevated hemoglobin.  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  May be stress leukocytosis, but I do not suspect acute infection.  TSH and vitamin B12 are normal.  Patient is reporting good insight and judgment to situation, forward thinking, and feel that patient is appropriate for discharge.  Patient does not meet criteria for inpatient management at this time.  I discussed with the patient that should he have any changes in his mental status, he should return to the emergency department immediately for evaluation.  He is in understanding to return for any suicidal thoughts or attempts.  Case was discussed with Dr. Kennis CarinaMichael Bero, who is in agreement with plan of care.   Final Clinical Impressions(s) / ED Diagnoses   Final diagnoses:  Severe episode of recurrent major depressive disorder, without psychotic features (HCC)  Leukocytosis, unspecified type    ED  Discharge Orders    None       Delia ChimesMurray, Lidie Glade B, PA-C 06/30/18 2031    Sabas SousBero, Michael M, MD 06/30/18 2042

## 2018-06-30 NOTE — ED Notes (Signed)
Pt unable to use restroom at this time. Pt aware of need for sample.

## 2018-06-30 NOTE — Discharge Instructions (Addendum)
Please see the information and instructions below regarding your visit.  Your diagnoses today include:  1. Severe episode of recurrent major depressive disorder, without psychotic features (HCC)   2. Leukocytosis, unspecified type     Tests performed today include: See side panel of your discharge paperwork for testing performed today. Vital signs are listed at the bottom of these instructions.   Medications prescribed:    Take any prescribed medications only as prescribed, and any over the counter medications only as directed on the packaging.  Please continue taking Zoloft as prescribed.  Home care instructions:  Please follow any educational materials contained in this packet.   Follow-up instructions: Please follow-up with [facility] either at the walk-in clinic from 8am-11am on weekdays, or per your previously scheduled appointment.  Return instructions:  Please return to the Emergency Department if you experience worsening symptoms.  Please return to the emergency department immediately if you develop any thoughts of harming yourself, have attempts to harm yourself, have worsening depressive thoughts or feeling hopeless. Please return if you have any other emergent concerns.  Additional Information:   Your vital signs today were: BP 116/81 (BP Location: Right Arm)    Pulse 95    Temp 98.6 F (37 C) (Oral)    Resp 18    Ht 5\' 8"  (1.727 m)    Wt 79.4 kg    SpO2 100%    BMI 26.61 kg/m  If your blood pressure (BP) was elevated on multiple readings during this visit above 130 for the top number or above 80 for the bottom number, please have this repeated by your primary care provider within one month. --------------  Thank you for allowing us to participate in your care today.

## 2018-06-30 NOTE — ED Notes (Signed)
Patient able to ambulate independently, refused discharge vitals, did not want to pull sweatshirt off again.

## 2018-06-30 NOTE — ED Notes (Signed)
APP at bedside
